# Patient Record
Sex: Male | Born: 2000 | Race: White | Hispanic: No | Marital: Single | Smoking: Never smoker
Health system: Southern US, Community
[De-identification: ages and names within clinical notes are randomized; demographics above are authoritative.]

## PROBLEM LIST (undated history)

## (undated) DIAGNOSIS — Z789 Other specified health status: Secondary | ICD-10-CM

## (undated) HISTORY — PX: TONSILLECTOMY AND ADENOIDECTOMY: SUR1326

## (undated) HISTORY — PX: OTHER SURGICAL HISTORY: SHX169

---
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
## 2018-07-18 ENCOUNTER — Emergency Department (HOSPITAL_COMMUNITY): Payer: Managed Care, Other (non HMO)

## 2018-07-18 ENCOUNTER — Emergency Department (HOSPITAL_COMMUNITY)
Admission: EM | Admit: 2018-07-18 | Discharge: 2018-07-18 | Disposition: A | Discharge status: Home / Self Care | Payer: Managed Care, Other (non HMO) | Attending: Emergency Medicine | Admitting: Emergency Medicine

## 2018-07-18 ENCOUNTER — Other Ambulatory Visit: Payer: Self-pay

## 2018-07-18 ENCOUNTER — Encounter (HOSPITAL_COMMUNITY): Payer: Self-pay

## 2018-07-18 DIAGNOSIS — R079 Chest pain, unspecified: Secondary | ICD-10-CM | POA: Diagnosis present

## 2018-07-18 DIAGNOSIS — R0789 Other chest pain: Secondary | ICD-10-CM | POA: Diagnosis not present

## 2018-07-18 MED ORDER — ALBUTEROL SULFATE HFA 108 (90 BASE) MCG/ACT IN AERS
4.0000 | INHALATION_SPRAY | Freq: Once | RESPIRATORY_TRACT | Status: AC
  Administered 2018-07-18: 21:00:00 4 via RESPIRATORY_TRACT
  Filled 2018-07-18: qty 6.7

## 2018-07-18 MED ORDER — AEROCHAMBER PLUS FLO-VU MEDIUM MISC
1.0000 | Freq: Once | Status: AC
  Administered 2018-07-18: 21:00:00 1

## 2018-07-18 NOTE — ED Provider Notes (Signed)
[name] EMERGENCY DEPARTMENT Provider Note   CSN: [id number] Arrival date & time: 07/18/18  1614    History   Chief Complaint Chief Complaint  Patient presents with  . Chest Pain    HPI [name] [name] is a 18 y.o. male who presents to the ED for non traumatic constant central chest pain for the past 3 days. He reports his chest pain onset after he played several hours of basketball and baseball. The patient describes his chest pain as feeling "like a knife in my chest" that is worse with deep inhalation. He states he believes his symptoms are related to a lung issue. Denies history of cardiac issues. Denies asthma or other chronic respiratory issues. 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 He states he has attempted to quit in the past without success. Reports ETOH use. Denies any known COID exposure. Denies any sore throat, cough, shortness of breath, palpitations, nausea, emesis, numbness/tinlging, or any other medical concerns at this time.  History reviewed. No pertinent past medical history.  There are no active problems to display for this patient.   Past Surgical History:  Procedure Laterality Date  . broken femur    . TONSILLECTOMY AND ADENOIDECTOMY          Home Medications    Prior to Admission medications   Not on File    Family History No family history on file.  Social History Social History   Tobacco Use  . Smoking status: Never Smoker  Substance Use Topics  . Alcohol use: Not on file  . Drug use: Not on file     Allergies   Patient has no known allergies.   Review of Systems Review of Systems  Constitutional: Negative for chills and fever.  HENT: Negative for ear pain and sore throat.   Eyes: Negative for pain and visual disturbance.  Respiratory: Negative for cough and shortness of breath.   Cardiovascular: Positive for chest pain (central). Negative for palpitations.  Gastrointestinal: Negative for  abdominal pain and vomiting.  Genitourinary: Negative for dysuria and hematuria.  Musculoskeletal: Negative for arthralgias and back pain.  Skin: Negative for color change and rash.  Neurological: Negative for seizures and syncope.  All other systems reviewed and are negative.    Physical Exam Updated Vital Signs BP 114/60 (BP Location: Right Arm)   Pulse 67   Temp 98.5 F (36.9 C) (Oral)   Resp 16   SpO2 99%   Physical Exam Vitals signs and nursing note reviewed.  Constitutional:      General: He is not in acute distress.    Appearance: He is well-developed and normal weight.  HENT:     Head: Normocephalic and atraumatic.  Eyes:     Conjunctiva/sclera: Conjunctivae normal.  Neck:     Musculoskeletal: Normal range of motion and neck supple.     Vascular: No JVD.  Cardiovascular:     Rate and Rhythm: Normal rate and regular rhythm.     Heart sounds: Normal heart sounds. No murmur. No friction rub. No gallop.   Pulmonary:     Effort: Pulmonary effort is normal. No respiratory distress.     Breath sounds: Normal breath sounds. No decreased breath sounds or wheezing.  Chest:     Chest wall: No tenderness.  Abdominal:     Palpations: Abdomen is soft.     Tenderness: There is no abdominal tenderness.  Musculoskeletal:     Right lower leg: No  edema.     Left lower leg: No edema.  Skin:    General: Skin is warm and dry.     Capillary Refill: Capillary refill takes less than 2 seconds.  Neurological:     Mental Status: He is alert and oriented to person, place, and time.  Psychiatric:        Mood and Affect: Mood normal.        Behavior: Behavior normal.      ED Treatments / Results  Labs (all labs ordered are listed, but only abnormal results are displayed) Labs Reviewed - No data to display  EKG EKG Interpretation  Date/Time:  Monday Jul 18 2018 16:19:57 EDT Ventricular Rate:  74 PR Interval:  158 QRS Duration: 92 QT Interval:  354 QTC Calculation: 392 R  Axis:   87 Text Interpretation:  Normal sinus rhythm Normal ECG No acute changes No old tracing to compare Nonspecific ST abnormality Confirmed by Derwood KaplanNanavati, Ankit [phone]) on 07/18/2018 7:50:07 PM   Radiology Dg Chest 2 View  Result Date: 07/18/2018 CLINICAL DATA:  Chest pain EXAM: CHEST - 2 VIEW COMPARISON:  None. FINDINGS: Lungs are clear. Heart size and pulmonary vascularity are normal. No adenopathy. No pneumothorax. No bone lesions. IMPRESSION: No edema or consolidation. Electronically Signed   By: Bretta BangWilliam  Woodruff III M.D.   On: 07/18/2018 17:01    Procedures Procedures (including critical care time)  Medications Ordered in ED Medications - No data to display   Initial Impression / Assessment and Plan / ED Course  I have reviewed the triage vital signs and the nursing notes.  Pertinent labs & imaging results that were available during my care of the patient were reviewed by me and considered in my medical decision making (see chart for details).  Clinical Course as of Jul 18 2114  Mon Jul 18, 2018  2115 The the patient reports improvement after the albuterol inhaler.   [SI]    Clinical Course User Index [SI] Ijaz, Saba        [name] [name] is a 18 y.o. male   [name] [name] is a 18 y.o. male who presents for chest pain the day after increased exercise. XR ordered and negative for infiltrates or pneumothorax or pneumomediatinum. EKG ordered and negative for ST segment changes.  Pain is most likely related to bronchospasm from increased vaping and deconditioning in combination with sudden increased in activity level.   Albuterol given with some improvement in pain.  Discharge to continue albuterol prn and recommended close follow up if pain is worsening or not improving.    Final Clinical Impressions(s) / ED Diagnoses   Final diagnoses:  Nonspecific chest pain    ED Discharge Orders    None     Scribe's Attestation: Lewis Moccasin , MD obtained and performed  the history, physical exam and medical decision making elements that were entered into the chart. Documentation assistance was provided by me personally, a scribe. Signed by Bebe LiterSaba Ijaz, Scribe on 07/18/2018 8:21 PM ? Documentation assistance provided by the scribe. I was present during the time the encounter was recorded. The information recorded by the scribe was done at my direction and has been reviewed and validated by me. Lewis Moccasin , MD 07/18/2018 8:21 PM     Vicki Malletalder,  K, MD 07/28/18 1136

## 2018-07-18 NOTE — ED Triage Notes (Signed)
Pt reports chest pain since Saturday after playing baseball. States it hurts worse when he takes a deep breath. "feels like a knife in my chest" Denies any other symptoms. Pt a.o, nad

## 2020-06-28 ENCOUNTER — Encounter (HOSPITAL_COMMUNITY): Payer: Self-pay

## 2020-06-28 ENCOUNTER — Other Ambulatory Visit: Payer: Self-pay

## 2020-06-28 ENCOUNTER — Emergency Department (HOSPITAL_COMMUNITY): Payer: Managed Care, Other (non HMO)

## 2020-06-28 ENCOUNTER — Emergency Department (HOSPITAL_COMMUNITY)
Admission: EM | Admit: 2020-06-28 | Discharge: 2020-06-28 | Disposition: A | Discharge status: Home / Self Care | Payer: Managed Care, Other (non HMO) | Attending: Emergency Medicine | Admitting: Emergency Medicine

## 2020-06-28 DIAGNOSIS — S62353A Nondisplaced fracture of shaft of third metacarpal bone, left hand, initial encounter for closed fracture: Secondary | ICD-10-CM | POA: Diagnosis not present

## 2020-06-28 DIAGNOSIS — S43112A Subluxation of left acromioclavicular joint, initial encounter: Secondary | ICD-10-CM | POA: Diagnosis not present

## 2020-06-28 DIAGNOSIS — Y92410 Unspecified street and highway as the place of occurrence of the external cause: Secondary | ICD-10-CM | POA: Diagnosis not present

## 2020-06-28 DIAGNOSIS — M25562 Pain in left knee: Secondary | ICD-10-CM | POA: Insufficient documentation

## 2020-06-28 DIAGNOSIS — S6992XA Unspecified injury of left wrist, hand and finger(s), initial encounter: Secondary | ICD-10-CM | POA: Diagnosis present

## 2020-06-28 DIAGNOSIS — M79632 Pain in left forearm: Secondary | ICD-10-CM | POA: Diagnosis not present

## 2020-06-28 HISTORY — DX: Other specified health status: Z78.9

## 2020-06-28 MED ORDER — IBUPROFEN 600 MG PO TABS: 600.0000 mg | ORAL_TABLET | Freq: Four times a day (QID) | ORAL | 0 refills | Status: AC | PRN

## 2020-06-28 MED ORDER — HYDROCODONE-ACETAMINOPHEN 5-325 MG PO TABS: 1.0000 | ORAL_TABLET | ORAL | 0 refills | Status: AC | PRN

## 2020-06-28 NOTE — ED Notes (Signed)
Pt back in room.

## 2020-06-28 NOTE — ED Notes (Signed)
Ortho tech at bedside at this time 

## 2020-06-28 NOTE — ED Notes (Signed)
Pt returned from radiology.
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
## 2020-06-28 NOTE — ED Notes (Signed)
Pt refused pain medication at this time. 

## 2020-06-28 NOTE — ED Notes (Signed)
Pt is in radiology at this time.  

## 2020-06-28 NOTE — ED Notes (Signed)
Pt transported to scans.  
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
## 2020-07-02 ENCOUNTER — Encounter: Payer: Self-pay | Admitting: Orthopedic Surgery

## 2020-07-02 ENCOUNTER — Ambulatory Visit (INDEPENDENT_AMBULATORY_CARE_PROVIDER_SITE_OTHER): Payer: Managed Care, Other (non HMO) | Admitting: Physician Assistant

## 2020-07-02 DIAGNOSIS — S6292XA Unspecified fracture of left wrist and hand, initial encounter for closed fracture: Secondary | ICD-10-CM | POA: Diagnosis not present
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
## 2020-07-23 ENCOUNTER — Ambulatory Visit (INDEPENDENT_AMBULATORY_CARE_PROVIDER_SITE_OTHER): Payer: Managed Care, Other (non HMO)

## 2020-07-23 ENCOUNTER — Encounter: Payer: Self-pay | Admitting: Physician Assistant

## 2020-07-23 ENCOUNTER — Ambulatory Visit: Payer: Managed Care, Other (non HMO) | Admitting: Physician Assistant

## 2020-07-23 ENCOUNTER — Other Ambulatory Visit: Payer: Self-pay

## 2020-07-23 DIAGNOSIS — S6292XA Unspecified fracture of left wrist and hand, initial encounter for closed fracture: Secondary | ICD-10-CM
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
## 2020-08-05 ENCOUNTER — Ambulatory Visit: Payer: Managed Care, Other (non HMO) | Admitting: Orthopedic Surgery
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
